# Patient Record
Sex: Male | Born: 2000
Health system: Southern US, Community
[De-identification: ages and names within clinical notes are randomized; demographics above are authoritative.]

## PROBLEM LIST (undated history)

## (undated) DIAGNOSIS — J309 Allergic rhinitis, unspecified: Secondary | ICD-10-CM

## (undated) HISTORY — DX: Allergic rhinitis, unspecified: J30.9

## (undated) HISTORY — PX: TYMPANOSTOMY: SHX2586

---
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
## 2021-03-15 ENCOUNTER — Ambulatory Visit
Admission: EM | Admit: 2021-03-15 | Discharge: 2021-03-15 | Disposition: A | Discharge status: Home / Self Care | Payer: 59 | Attending: Emergency Medicine | Admitting: Emergency Medicine

## 2021-03-15 ENCOUNTER — Other Ambulatory Visit: Payer: Self-pay

## 2021-03-15 DIAGNOSIS — R0989 Other specified symptoms and signs involving the circulatory and respiratory systems: Secondary | ICD-10-CM | POA: Diagnosis not present

## 2021-03-15 DIAGNOSIS — J069 Acute upper respiratory infection, unspecified: Secondary | ICD-10-CM | POA: Diagnosis not present

## 2021-03-15 MED ORDER — PROMETHAZINE-DM 6.25-15 MG/5ML PO SYRP: 5.0000 mL | ORAL_SOLUTION | Freq: Four times a day (QID) | ORAL | 0 refills | Status: DC | PRN

## 2021-03-15 MED ORDER — OSELTAMIVIR PHOSPHATE 75 MG PO CAPS: 75.0000 mg | ORAL_CAPSULE | Freq: Two times a day (BID) | ORAL | 0 refills | Status: DC

## 2021-03-15 MED ORDER — IBUPROFEN 600 MG PO TABS: 600.0000 mg | ORAL_TABLET | Freq: Four times a day (QID) | ORAL | 0 refills | Status: DC | PRN

## 2021-03-15 MED ORDER — FLUTICASONE PROPIONATE 50 MCG/ACT NA SUSP: 2.0000 | Freq: Every day | NASAL | 0 refills | Status: DC

## 2021-03-15 NOTE — ED Triage Notes (Signed)
Patient presents to Urgent Care with complaints of  cough, congestion, fever, body aches, headache, chills since yesterday.   Treating symptoms with ibuprofen and tylenol.

## 2021-03-15 NOTE — ED Provider Notes (Signed)
HPI  SUBJECTIVE:  Mark Rush is a 20 y.o. male who presents with fevers T-max 102.8, body aches, nasal congestion Moderna strep, headache, rhinorrhea, sore throat, nausea, cough productive of the same material as his nasal congestion, shortness of breath, vomiting starting yesterday.  He is tolerating fluids.  No loss of sense of smell or taste, wheezing, nausea, diarrhea, abdominal pain.  No known COVID or flu exposure.  He got his second dose of the COVID-vaccine.  He has not yet gotten the flu vaccine.  He has been alternating 400 mg of ibuprofen with 1-2 Tylenols every 4 hours with improvement in his symptoms.  He has also tried TheraFlu.  Symptoms are worse at night.  States that he is unable to sleep at night secondary to the cough.  He took an antipyretic within 6 hours of evaluation.  He has no past medical history.  PMD: [employer].   History reviewed. No pertinent past medical history.  History reviewed. No pertinent surgical history.  History reviewed. No pertinent family history.  Social History   Tobacco Use   Smoking status: Never   Smokeless tobacco: Never  Vaping Use   Vaping Use: Never used  Substance Use Topics   Alcohol use: Never   Drug use: Never    No current facility-administered medications for this encounter.  Current Outpatient Medications:    fluticasone (FLONASE) 50 MCG/ACT nasal spray, Place 2 sprays into both nostrils daily., Disp: 16 g, Rfl: 0   ibuprofen (ADVIL) 600 MG tablet, Take 1 tablet (600 mg total) by mouth every 6 (six) hours as needed., Disp: 30 tablet, Rfl: 0   oseltamivir (TAMIFLU) 75 MG capsule, Take 1 capsule (75 mg total) by mouth 2 (two) times daily. X 5 days, Disp: 10 capsule, Rfl: 0   promethazine-dextromethorphan (PROMETHAZINE-DM) 6.25-15 MG/5ML syrup, Take 5 mLs by mouth 4 (four) times daily as needed for cough., Disp: 118 mL, Rfl: 0  No Known Allergies   ROS  As noted in HPI.   Physical Exam  BP 134/76  (BP Location: Right Arm)   Pulse 96   Temp 100.1 F (37.8 C) (Oral)   Resp 16   SpO2 97%   Constitutional: Well developed, well nourished, no acute distress Eyes:  EOMI, conjunctiva normal bilaterally HENT: Normocephalic, atraumatic,mucus membranes moist.  Positive nasal congestion.  No sinus tenderness. Neck: No cervical lymphadenopathy Respiratory: Normal inspiratory effort, lungs clear bilaterally Cardiovascular: Normal rate, regular rhythm no murmurs rubs or gallops GI: nondistended skin: No rash, skin intact Musculoskeletal: no deformities Neurologic: Alert & oriented x 3, no focal neuro deficits Psychiatric: Speech and behavior appropriate   ED Course   Medications - No data to display  Orders Placed This Encounter  Procedures   Covid-19, Flu A+B ([facility])    Standing Status:   Standing    Number of Occurrences:   1    No results found for this or any previous visit (from the past 24 hour(s)). No results found.  ED Clinical Impression  1. Viral upper respiratory tract infection with cough   2. Suspected novel influenza A virus infection      ED Assessment/Plan  COVID, flu sent.  Suspect influenza.  Home with Tamiflu, Tylenol/ibuprofen, Mucinex D, Flonase, Promethazine DM, saline nasal irrigation and push fluids.  Unfortunately, he does not qualify for antiviral treatment if his COVID is positive.  Supportive treatment only.  Follow-up with PMD as needed.  ER return precautions given.  School note  Discussed  labs,, MDM, treatment plan, and plan for follow-up with patient. Discussed sn/sx that should prompt return to the ED. patient agrees with plan.   Meds ordered this encounter  Medications   fluticasone (FLONASE) 50 MCG/ACT nasal spray    Sig: Place 2 sprays into both nostrils daily.    Dispense:  16 g    Refill:  0   ibuprofen (ADVIL) 600 MG tablet    Sig: Take 1 tablet (600 mg total) by mouth every 6 (six) hours as needed.    Dispense:  30 tablet     Refill:  0   oseltamivir (TAMIFLU) 75 MG capsule    Sig: Take 1 capsule (75 mg total) by mouth 2 (two) times daily. X 5 days    Dispense:  10 capsule    Refill:  0   promethazine-dextromethorphan (PROMETHAZINE-DM) 6.25-15 MG/5ML syrup    Sig: Take 5 mLs by mouth 4 (four) times daily as needed for cough.    Dispense:  118 mL    Refill:  0      *This clinic note was created using Scientist, clinical (histocompatibility and immunogenetics). Therefore, there may be occasional mistakes despite careful proofreading.  ?    Domenick Gong, MD 03/16/21 [id number]

## 2021-03-15 NOTE — Discharge Instructions (Addendum)
Finish the Tamiflu unless your flu comes back negative, 600 mg of ibuprofen, 1000 mg of Tylenol for 4 times a day as needed for body aches, headaches, fevers., Mucinex D, Flonase, Promethazine DM, saline nasal irrigation with a Lloyd Huger Med rinse and distilled water as often as you want and push electrolyte containing fluids such as Pedialyte and Gatorade.  Stop other cold medicine

## 2021-03-16 LAB — COVID-19, FLU A+B NAA
Influenza A, NAA: NOT DETECTED
Influenza B, NAA: NOT DETECTED
SARS-CoV-2, NAA: NOT DETECTED

## 2021-03-17 ENCOUNTER — Encounter (HOSPITAL_COMMUNITY): Payer: Self-pay

## 2021-03-17 ENCOUNTER — Emergency Department (HOSPITAL_COMMUNITY)
Admission: EM | Admit: 2021-03-17 | Discharge: 2021-03-18 | Disposition: A | Discharge status: Home / Self Care | Payer: 59 | Attending: Emergency Medicine | Admitting: Emergency Medicine

## 2021-03-17 ENCOUNTER — Other Ambulatory Visit: Payer: Self-pay

## 2021-03-17 DIAGNOSIS — R509 Fever, unspecified: Secondary | ICD-10-CM | POA: Diagnosis present

## 2021-03-17 DIAGNOSIS — R000 Tachycardia, unspecified: Secondary | ICD-10-CM | POA: Insufficient documentation

## 2021-03-17 DIAGNOSIS — Z20822 Contact with and (suspected) exposure to covid-19: Secondary | ICD-10-CM | POA: Diagnosis not present

## 2021-03-17 DIAGNOSIS — R0602 Shortness of breath: Secondary | ICD-10-CM | POA: Insufficient documentation

## 2021-03-17 DIAGNOSIS — R1084 Generalized abdominal pain: Secondary | ICD-10-CM | POA: Insufficient documentation

## 2021-03-17 DIAGNOSIS — B2790 Infectious mononucleosis, unspecified without complication: Secondary | ICD-10-CM | POA: Diagnosis not present

## 2021-03-17 DIAGNOSIS — R079 Chest pain, unspecified: Secondary | ICD-10-CM | POA: Insufficient documentation

## 2021-03-17 LAB — RESP PANEL BY RT-PCR (FLU A&B, COVID) ARPGX2
Influenza A by PCR: NEGATIVE
Influenza B by PCR: NEGATIVE
SARS Coronavirus 2 by RT PCR: NEGATIVE

## 2021-03-17 LAB — URINALYSIS, ROUTINE W REFLEX MICROSCOPIC
Bacteria, UA: NONE SEEN
Bilirubin Urine: NEGATIVE
Glucose, UA: NEGATIVE mg/dL
Ketones, ur: 20 mg/dL — AB
Leukocytes,Ua: NEGATIVE
Nitrite: NEGATIVE
Protein, ur: 100 mg/dL — AB
Specific Gravity, Urine: 1.019 (ref 1.005–1.030)
pH: 5 (ref 5.0–8.0)

## 2021-03-17 LAB — COMPREHENSIVE METABOLIC PANEL
ALT: 649 U/L — ABNORMAL HIGH (ref 0–44)
AST: 846 U/L — ABNORMAL HIGH (ref 15–41)
Albumin: 3.7 g/dL (ref 3.5–5.0)
Alkaline Phosphatase: 144 U/L — ABNORMAL HIGH (ref 38–126)
Anion gap: 14 (ref 5–15)
BUN: 13 mg/dL (ref 6–20)
CO2: 22 mmol/L (ref 22–32)
Calcium: 8.7 mg/dL — ABNORMAL LOW (ref 8.9–10.3)
Chloride: 97 mmol/L — ABNORMAL LOW (ref 98–111)
Creatinine, Ser: 1.07 mg/dL (ref 0.61–1.24)
GFR, Estimated: >60 mL/min (ref >60)
Glucose, Bld: 85 mg/dL (ref 70–99)
Potassium: 3.4 mmol/L — ABNORMAL LOW (ref 3.5–5.1)
Sodium: 133 mmol/L — ABNORMAL LOW (ref 135–145)
Total Bilirubin: 1.8 mg/dL — ABNORMAL HIGH (ref 0.3–1.2)
Total Protein: 7.4 g/dL (ref 6.5–8.1)

## 2021-03-17 LAB — CBC WITH DIFFERENTIAL/PLATELET
Abs Immature Granulocytes: 0.12 10*3/uL — ABNORMAL HIGH (ref 0.00–0.07)
Basophils Absolute: 0.1 10*3/uL (ref 0.0–0.1)
Basophils Relative: 1 %
Eosinophils Absolute: 0 10*3/uL (ref 0.0–0.5)
Eosinophils Relative: 0 %
HCT: 40.8 % (ref 39.0–52.0)
Hemoglobin: 13.7 g/dL (ref 13.0–17.0)
Immature Granulocytes: 1 %
Lymphocytes Relative: 47 %
Lymphs Abs: 4.2 10*3/uL — ABNORMAL HIGH (ref 0.7–4.0)
MCH: 23.4 pg — ABNORMAL LOW (ref 26.0–34.0)
MCHC: 33.6 g/dL (ref 30.0–36.0)
MCV: 69.6 fL — ABNORMAL LOW (ref 80.0–100.0)
Monocytes Absolute: 0.3 10*3/uL (ref 0.1–1.0)
Monocytes Relative: 3 %
Neutro Abs: 4.3 10*3/uL (ref 1.7–7.7)
Neutrophils Relative %: 48 %
Platelets: 148 10*3/uL — ABNORMAL LOW (ref 150–400)
RBC: 5.86 MIL/uL — ABNORMAL HIGH (ref 4.22–5.81)
RDW: 19.3 % — ABNORMAL HIGH (ref 11.5–15.5)
WBC Morphology: ABNORMAL
WBC: 9.1 10*3/uL (ref 4.0–10.5)
nRBC: 0 % (ref 0.0–0.2)

## 2021-03-17 LAB — GROUP A STREP BY PCR: Group A Strep by PCR: NOT DETECTED

## 2021-03-17 LAB — LIPASE, BLOOD: Lipase: 42 U/L (ref 11–51)

## 2021-03-17 MED ORDER — LACTATED RINGERS IV BOLUS
1000.0000 mL | Freq: Once | INTRAVENOUS | Status: AC
Start: 2021-03-17 — End: 2021-03-18
  Administered 2021-03-17: 1000 mL via INTRAVENOUS

## 2021-03-17 MED ORDER — LACTATED RINGERS IV BOLUS
1000.0000 mL | Freq: Once | INTRAVENOUS | Status: AC
  Administered 2021-03-17: 1000 mL via INTRAVENOUS

## 2021-03-17 MED ORDER — ONDANSETRON HCL 4 MG/2ML IJ SOLN
4.0000 mg | Freq: Once | INTRAMUSCULAR | Status: AC
  Administered 2021-03-17: 4 mg via INTRAVENOUS
  Filled 2021-03-17: qty 2

## 2021-03-17 MED ORDER — ACETAMINOPHEN 325 MG PO TABS
650.0000 mg | ORAL_TABLET | Freq: Once | ORAL | Status: AC | PRN
  Administered 2021-03-17: 650 mg via ORAL
  Filled 2021-03-17: qty 2

## 2021-03-17 NOTE — ED Triage Notes (Signed)
Pt presents to Ed with flu like sx that started 2 days ago, pt was seen urgent care and dx with URI-pt tested neg for flu and covid- prescribed promethazine-dextromethorphan (PROMETHAZINE-DM) for N/V and tylenol/motrin for fever and pain relief- pt has not taken these since 1 pm today. Temp 103.3 in triage.

## 2021-03-18 ENCOUNTER — Emergency Department (HOSPITAL_COMMUNITY): Payer: 59

## 2021-03-18 LAB — MONONUCLEOSIS SCREEN: Mono Screen: POSITIVE — AB

## 2021-03-18 MED ORDER — IOHEXOL 300 MG/ML  SOLN
100.0000 mL | Freq: Once | INTRAMUSCULAR | Status: AC | PRN
  Administered 2021-03-18: 100 mL via INTRAVENOUS

## 2021-03-18 MED ORDER — ONDANSETRON 4 MG PO TBDP: 4.0000 mg | ORAL_TABLET | Freq: Three times a day (TID) | ORAL | 0 refills | Status: DC | PRN

## 2021-03-18 MED ORDER — IBUPROFEN 600 MG PO TABS: 600.0000 mg | ORAL_TABLET | Freq: Four times a day (QID) | ORAL | 0 refills | Status: DC | PRN

## 2021-03-18 MED ORDER — LACTATED RINGERS IV BOLUS
1000.0000 mL | Freq: Once | INTRAVENOUS | Status: AC
  Administered 2021-03-18: 1000 mL via INTRAVENOUS

## 2021-03-18 NOTE — ED Provider Notes (Signed)
[facility] EMERGENCY DEPARTMENT Provider Note   CSN: [id number] Arrival date & time: 03/17/21  [id number]     History Chief Complaint  Patient presents with   URI    Tested - for flu and covid-dx with URI 2 days ago    Mark Rush is a 20 y.o. male otherwise healthy presents the emergency department for evaluation of 4 days of sore throat, myalgia, headache, fever (T-max 102.8 Fahrenheit), cough, nausea, vomiting, and generalized abdominal pain.  Patient endorses some mild shortness of breath and chest pain with cough.  Denies any diarrhea, constipation, dysuria, hematuria, rash, ear pain, runny nose, or nasal congestion.  Patient was recently seen at urgent care few days ago and had a negative flu and COVID test, but was treated with Tamiflu and given promethazine and ibuprofen 600 mg for fever.  The patient reports despite the treatment, he is feeling worse.  He reports his fever is going down with ibuprofen, but missed his morning dose today.  He denies any medical history.  Surgical history includes TM tubes.  Denies any daily medications.  No known drug allergies.  Up-to-date on vaccinations as a child.  Denies any tobacco, EtOH, or drug use.   URI Presenting symptoms: congestion, cough, fever, rhinorrhea and sore throat   Presenting symptoms: no ear pain   Associated symptoms: headaches and myalgias   Associated symptoms: no arthralgias       History reviewed. No pertinent past medical history.  There are no problems to display for this patient.   History reviewed. No pertinent surgical history.     No family history on file.  Social History   Tobacco Use   Smoking status: Never   Smokeless tobacco: Never  Vaping Use   Vaping Use: Never used  Substance Use Topics   Alcohol use: Never   Drug use: Never    Home Medications Prior to Admission medications   Medication Sig Start Date End Date Taking? Authorizing Provider  ibuprofen (ADVIL) 600 MG tablet Take 1  tablet (600 mg total) by mouth every 6 (six) hours as needed. 03/18/21  Yes Sherrell Puller, PA-C  ondansetron (ZOFRAN ODT) 4 MG disintegrating tablet Take 1 tablet (4 mg total) by mouth every 8 (eight) hours as needed for nausea or vomiting. 03/18/21  Yes Sherrell Puller, PA-C  fluticasone [facility]) 50 MCG/ACT nasal spray Place 2 sprays into both nostrils daily. 03/15/21   Melynda Ripple, MD  ibuprofen (ADVIL) 600 MG tablet Take 1 tablet (600 mg total) by mouth every 6 (six) hours as needed. 03/15/21   Melynda Ripple, MD  oseltamivir (TAMIFLU) 75 MG capsule Take 1 capsule (75 mg total) by mouth 2 (two) times daily. X 5 days 03/15/21   Melynda Ripple, MD  promethazine-dextromethorphan (PROMETHAZINE-DM) 6.25-15 MG/5ML syrup Take 5 mLs by mouth 4 (four) times daily as needed for cough. 03/15/21   Melynda Ripple, MD    Allergies    Patient has no known allergies.  Review of Systems   Review of Systems  Constitutional:  Positive for fever. Negative for chills.  HENT:  Positive for congestion, rhinorrhea and sore throat. Negative for drooling, ear pain and trouble swallowing.   Eyes:  Negative for photophobia, pain, discharge and visual disturbance.  Respiratory:  Positive for cough and shortness of breath.   Cardiovascular:  Negative for chest pain and palpitations.  Gastrointestinal:  Positive for nausea and vomiting. Negative for abdominal pain, constipation and diarrhea.  Genitourinary:  Negative for dysuria and hematuria.  Musculoskeletal:  Positive for myalgias. Negative for arthralgias, back pain and joint swelling.  Skin:  Negative for color change and rash.  Neurological:  Positive for headaches. Negative for seizures, syncope and weakness.  All other systems reviewed and are negative.  Physical Exam Updated Vital Signs BP 131/82   Pulse 61   Temp 98.6 F (37 C) (Oral)   Resp 17   Ht 6' (1.829 m)   Wt 81.6 kg   SpO2 98%   BMI 24.41 kg/m   Physical Exam Vitals and  nursing note reviewed.  Constitutional:      General: He is not in acute distress.    Appearance: He is not toxic-appearing.     Comments: Patient appears uncomfortable, but not toxic appearing  HENT:     Head: Normocephalic and atraumatic.     Right Ear: Tympanic membrane, ear canal and external ear normal.     Left Ear: Tympanic membrane, ear canal and external ear normal.     Nose:     Comments: Bilateral nasal turbinates slightly erythematous and edematous    Mouth/Throat:     Mouth: Mucous membranes are dry.     Pharynx: Oropharyngeal exudate present.     Comments: Dry mucous membranes.  Tonsils are 2+ bilaterally with small white exudates throughout.  Uvula midline.  Airway patent.  Patient speaking in full sentences with ease.  No voice changes.  Controlling secretions. Neck:     Comments: No nuchal rigidity.  Patient has full range of motion of neck.  Tonsillar lymphadenopathy.  No supraclavicular lymphadenopathy. Cardiovascular:     Rate and Rhythm: Tachycardia present.  Pulmonary:     Effort: Pulmonary effort is normal. No respiratory distress.     Breath sounds: Normal breath sounds. No wheezing.  Abdominal:     General: Abdomen is flat.     Palpations: Abdomen is soft.     Tenderness: There is abdominal tenderness. There is no guarding or rebound.     Comments: Diffuse tenderness.  No hepatosplenomegaly appreciated.  No overlying skin changes, rashes, ecchymosis, or erythema noted to the area.  Musculoskeletal:        General: No swelling or deformity.     Cervical back: Normal range of motion.  Lymphadenopathy:     Cervical: Cervical adenopathy present.  Skin:    General: Skin is warm.     Findings: No rash.  Neurological:     General: No focal deficit present.     Mental Status: He is alert. Mental status is at baseline.    ED Results / Procedures / Treatments   Labs (all labs ordered are listed, but only abnormal results are displayed) Labs Reviewed   COMPREHENSIVE METABOLIC PANEL - Abnormal; Notable for the following components:      Result Value   Sodium 133 (*)    Potassium 3.4 (*)    Chloride 97 (*)    Calcium 8.7 (*)    AST 846 (*)    ALT 649 (*)    Alkaline Phosphatase 144 (*)    Total Bilirubin 1.8 (*)    All other components within normal limits  CBC WITH DIFFERENTIAL/PLATELET - Abnormal; Notable for the following components:   RBC 5.86 (*)    MCV 69.6 (*)    MCH 23.4 (*)    RDW 19.3 (*)    Platelets 148 (*)    Lymphs Abs 4.2 (*)    Abs Immature Granulocytes 0.12 (*)    All other components  within normal limits  URINALYSIS, ROUTINE W REFLEX MICROSCOPIC - Abnormal; Notable for the following components:   Color, Urine AMBER (*)    APPearance HAZY (*)    Hgb urine dipstick SMALL (*)    Ketones, ur 20 (*)    Protein, ur 100 (*)    All other components within normal limits  MONONUCLEOSIS SCREEN - Abnormal; Notable for the following components:   Mono Screen POSITIVE (*)    All other components within normal limits  RESP PANEL BY RT-PCR (FLU A&B, COVID) ARPGX2  GROUP A STREP BY PCR  LIPASE, BLOOD    EKG None  Radiology CT ABDOMEN PELVIS W CONTRAST  Result Date: 03/18/2021 CLINICAL DATA:  Elevated LFTs with fevers and chills, initial encounter EXAM: CT ABDOMEN AND PELVIS WITH CONTRAST TECHNIQUE: Multidetector CT imaging of the abdomen and pelvis was performed using the standard protocol following bolus administration of intravenous contrast. CONTRAST:  148mL OMNIPAQUE IOHEXOL 300 MG/ML  SOLN COMPARISON:  None. FINDINGS: Lower chest: Lung bases demonstrate small bilateral pleural effusions. Minimal atelectatic changes are seen. Hepatobiliary: No focal liver abnormality is seen. No gallstones, gallbladder wall thickening, or biliary dilatation. Pancreas: Unremarkable. No pancreatic ductal dilatation or surrounding inflammatory changes. Spleen: Normal in size without focal abnormality. Adrenals/Urinary Tract: Adrenal  glands are within normal limits. Kidneys demonstrate no renal calculi or obstructive changes. Enhancement of the kidneys is patchy bilaterally suggestive of pyelonephritis. No obstructive changes are seen. The bladder is well distended. No obstructive changes the ureters noted. Stomach/Bowel: No obstructive or inflammatory changes of the colon are seen. The appendix is within normal limits. Small bowel and stomach are within normal limits. Vascular/Lymphatic: No significant vascular findings are present. No enlarged abdominal or pelvic lymph nodes. Reproductive: Prostate is unremarkable. Other: No abdominal wall hernia or abnormality. No abdominopelvic ascites. Musculoskeletal: No acute or significant osseous findings. IMPRESSION: Patchy enhancement pattern in the kidneys bilaterally suggestive of pyelonephritis. No obstructive changes are seen. Small bilateral pleural effusions are noted. Electronically Signed   By: Inez Catalina M.D.   On: 03/18/2021 00:45    Procedures Procedures   Medications Ordered in ED Medications  acetaminophen (TYLENOL) tablet 650 mg (650 mg Oral Given 03/17/21 2025)  ondansetron (ZOFRAN) injection 4 mg (4 mg Intravenous Given 03/17/21 2233)  lactated ringers bolus 1,000 mL (0 mLs Intravenous Stopped 03/17/21 2352)  lactated ringers bolus 1,000 mL (0 mLs Intravenous Stopped 03/18/21 0059)  iohexol (OMNIPAQUE) 300 MG/ML solution 100 mL (100 mLs Intravenous Contrast Given 03/18/21 0034)  lactated ringers bolus 1,000 mL (1,000 mLs Intravenous New Bag/Given 03/18/21 0116)    ED Course  I have reviewed the triage vital signs and the nursing notes.  Pertinent labs & imaging results that were available during my care of the patient were reviewed by me and considered in my medical decision making (see chart for details).  19 year old male presents the emergency department with 4 days of cough and cold symptoms.  Differential diagnosis includes but is not limited to COVID, flu,  strep, mono, meningitis, viral illness.  Patient clinically appears dehydrated.  Concern for strep given the pharyngeal erythema with exudate.  Will repeat COVID, flu, and strep.  Additionally, will order basic labs.  Patient was initially started on 1 L of LR and given 4 mg of Zofran.  Patient initially presented tachycardic with a high fever of 103.53F when he was given Tylenol.  Since, patient is afebrile and normal heart rate.  He is still not urinated after 1 L  fluid.  Additional liter of LR ordered.  Patient reports he is no longer feeling nauseous.  I personally reviewed and interpreted the patient's labs and imaging.  Respiratory panel was negative for COVID and flu.  Group A strep negative.  Lipase negative.  CMP shows mild hyponatremia and hypokalemia.  Liver enzymes significantly elevated.  Urinalysis shows amber, hazy urine with ketones and protein.  Consistent with dehydration.  No signs of UTI.  CBC shows no leukocytosis or anemia.  Slight decrease in platelets at 148.  Given any increase in LFTs, a mono spot test was ordered and CT abdomen was placed.  Mono came back as positive.  Additional liter of fluid was ordered at this time.  CT abdomen showed patchy enhancement pattern in the kidneys bilaterally suggestive of pyelonephritis. No obstructive changes are seen. Small bilateral pleural effusions are noted.  No hepatosplenomegaly appreciated.  No concern for pyelonephritis as the patient does not have any urinary symptoms.  Lab and imaging results were discussed with parent and patient in the room.  I discussed with him that the treatment for mono is supportive care, i.e. to continue with the ibuprofen 60 mg every 6 hours, maintaining good hydration, and resting.  I gave him strict precautions against any contact sports to prevent any splenic rupture.  I advised him to follow-up with their primary care office at [facility] for recheck of his platelets and LFTs in 1 week.  Prescribed the  patient Zofran and additional refill of the ibuprofen 600 mg to take.  I advised mom against using Tylenol as his LFTs are increased.  Strict return precautions given.  Parent and patient agree to plan.  Patient is stable and being discharged home in good condition.    MDM Rules/Calculators/A&P                          Final Clinical Impression(s) / ED Diagnoses Final diagnoses:  Infectious mononucleosis without complication, infectious mononucleosis due to unspecified organism    Rx / DC Orders ED Discharge Orders          Ordered    ondansetron (ZOFRAN ODT) 4 MG disintegrating tablet  Every 8 hours PRN        03/18/21 0145    ibuprofen (ADVIL) 600 MG tablet  Every 6 hours PRN        03/18/21 0145             Sherrell Puller, PA-C 03/18/21 0214    Milton Ferguson, MD 03/19/21 1103

## 2021-03-18 NOTE — Discharge Instructions (Addendum)
You are seen here today for evaluation of your cough and cold symptoms.  You are diagnosed with mono.  The treatment for mono is supportive care, so please keep continuing using the ibuprofen 600 mg every 6 hours, drinking plenty of fluids, mainly water, and resting.  You have been prescribed Zofran, an antinausea medication, to take as needed.  With mono you want to avoid any contact sports for 2 to 4 weeks.  Please follow-up with your primary care provider in a week to recheck your liver enzymes.  If you have any concern, new or worsening symptoms, please return to the nearest emergency department for reevaluation.

## 2021-04-19 ENCOUNTER — Ambulatory Visit (INDEPENDENT_AMBULATORY_CARE_PROVIDER_SITE_OTHER): Payer: 59 | Admitting: Internal Medicine

## 2021-04-19 ENCOUNTER — Encounter: Payer: Self-pay | Admitting: Internal Medicine

## 2021-04-19 ENCOUNTER — Other Ambulatory Visit: Payer: Self-pay

## 2021-04-19 VITALS — BP 150/88 | HR 104 | Temp 104.0°F | Ht 72.0 in | Wt 173.4 lb

## 2021-04-19 DIAGNOSIS — R7989 Other specified abnormal findings of blood chemistry: Secondary | ICD-10-CM

## 2021-04-19 NOTE — Progress Notes (Signed)
Primary Care Physician:  Samuella Bruin Primary Gastroenterologist:  Dr. Marletta Lor  Chief Complaint  Patient presents with   Elevated Hepatic Enzymes    HPI:   Mark Rush is a 20 y.o. male who presents to clinic today by referral from his PCP Oletta Darter due to abnormal liver function test.  Patient was diagnosed with mononucleosis approximately 1 month ago.  He presented to the ER given worsening fever and upper respiratory symptoms.  Blood work showed significantly elevated liver function tests, AST 846, ALT 649, T bili 1.8, alk phos 144.  CT abdomen pelvis unremarkable from a GI standpoint.  Did make mention of small bilateral pleural effusions as well as possible pyelonephritis without obstructive changes.  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  He had repeat blood work on 04/06/2021 with improved LFTs, AST 134, ALT 178, alk phos 171, T bili 0.8.  Patient denies any personal or family history of liver disease.  No illicit drug use.  No alcohol use.  No herbal supplements.  No exposure to viral hepatitis that he knows of.  Was taking Tylenol while he was sick with mono and having fevers though states he never took more than 2 pills in a day.  No abdominal pain.  Past Medical History:  Diagnosis Date   Allergic rhinitis     Past Surgical History:  Procedure Laterality Date   TYMPANOSTOMY      No current outpatient medications on file.   No current facility-administered medications for this visit.    Allergies as of 04/19/2021   (No Known Allergies)    Family History  Problem Relation Age of Onset   Multiple sclerosis Mother     Social History   Socioeconomic History   Marital status: Single    Spouse name: Not on file   Number of children: Not on file   Years of education: Not on file   Highest education level: Not on file  Occupational History   Not on file  Tobacco Use   Smoking status: Never   Smokeless  tobacco: Never  Vaping Use   Vaping Use: Never used  Substance and Sexual Activity   Alcohol use: Never   Drug use: Never   Sexual activity: Not on file  Other Topics Concern   Not on file  Social History Narrative   Not on file   Social Determinants of Health   Financial Resource Strain: Not on file  Food Insecurity: Not on file  Transportation Needs: Not on file  Physical Activity: Not on file  Stress: Not on file  Social Connections: Not on file  Intimate Partner Violence: Not on file    Subjective: Review of Systems  Constitutional:  Negative for chills and fever.  HENT:  Negative for congestion and hearing loss.   Eyes:  Negative for blurred vision and double vision.  Respiratory:  Negative for cough and shortness of breath.   Cardiovascular:  Negative for chest pain and palpitations.  Gastrointestinal:  Negative for abdominal pain, blood in stool, constipation, diarrhea, heartburn, melena and vomiting.  Genitourinary:  Negative for dysuria and urgency.  Musculoskeletal:  Negative for joint pain and myalgias.  Skin:  Negative for itching and rash.  Neurological:  Negative for dizziness and headaches.  Psychiatric/Behavioral:  Negative for depression. The patient is not nervous/anxious.       Objective: BP (!) 150/88    Pulse (!) 104    Temp (!) 104  F (40 C)    Ht 6' (1.829 m)    Wt 173 lb 6.4 oz (78.7 kg)    BMI 23.52 kg/m  Physical Exam Constitutional:      Appearance: Normal appearance.  HENT:     Head: Normocephalic and atraumatic.  Eyes:     Extraocular Movements: Extraocular movements intact.     Conjunctiva/sclera: Conjunctivae normal.  Cardiovascular:     Rate and Rhythm: Normal rate and regular rhythm.  Pulmonary:     Effort: Pulmonary effort is normal.     Breath sounds: Normal breath sounds.  Abdominal:     General: Bowel sounds are normal.     Palpations: Abdomen is soft.  Musculoskeletal:        General: Normal range of motion.      Cervical back: Normal range of motion and neck supple.  Skin:    General: Skin is warm.  Neurological:     General: No focal deficit present.     Mental Status: He is alert and oriented to person, place, and time.  Psychiatric:        Mood and Affect: Mood normal.        Behavior: Behavior normal.     Assessment: *Abnormal liver function tests  Plan: Patient's abnormal liver function test likely consequence of his viral infection with EBV.  CT reassuring from a hepatobiliary standpoint.  LFTs already improved on most recent blood test.  We will plan on repeat LFTs in 2 weeks to ensure that they continue to improve as expected.  If still elevated, will perform full serological work-up for other underlying liver conditions.    Patient to follow-up in 3 to 4 months or sooner if needed.  Thank you Collene Mares for the kind referral.  04/19/2021 9:33 AM   Disclaimer: This note was dictated with voice recognition software. Similar sounding words can inadvertently be transcribed and may not be corrected upon review.

## 2021-04-19 NOTE — Patient Instructions (Signed)
Likely the cause of your elevated liver function tests was due to mononucleosis infection.  That being said, I am going to check blood work at [facility] today to rule out other conditions.  Follow-up with GI in 3 to 4 months.  It was nice meeting you today.  Enjoy your holiday vacation from school.  Dr. Marletta Lor  At [facility] we value your feedback. You may receive a survey about your visit today. Please share your experience as we strive to create trusting relationships with our patients to provide genuine, compassionate, quality care.  We appreciate your understanding and patience as we review any laboratory studies, imaging, and other diagnostic tests that are ordered as we care for you. Our office policy is 5 business days for review of these results, and any emergent or urgent results are addressed in a timely manner for your best interest. If you do not hear from our office in 1 week, please contact us.   We also encourage the use of MyChart, which contains your medical information for your review as well. If you are not enrolled in this feature, an access code is on this after visit summary for your convenience. Thank you for allowing [place] to be involved in your care.  It was great to see you today!  I hope you have a great rest of your Winter!    Hennie Duos. Marletta Lor, D.O. [facility]ology [facility]

## 2021-07-12 ENCOUNTER — Encounter: Payer: Self-pay | Admitting: Internal Medicine

## 2022-07-06 ENCOUNTER — Other Ambulatory Visit: Payer: Self-pay

## 2022-07-06 ENCOUNTER — Encounter (HOSPITAL_COMMUNITY): Payer: Self-pay | Admitting: Emergency Medicine

## 2022-07-06 ENCOUNTER — Emergency Department (HOSPITAL_COMMUNITY): Payer: 59

## 2022-07-06 ENCOUNTER — Emergency Department (HOSPITAL_COMMUNITY)
Admission: EM | Admit: 2022-07-06 | Discharge: 2022-07-06 | Disposition: A | Discharge status: Home / Self Care | Payer: 59 | Attending: Emergency Medicine | Admitting: Emergency Medicine

## 2022-07-06 DIAGNOSIS — Z20822 Contact with and (suspected) exposure to covid-19: Secondary | ICD-10-CM | POA: Diagnosis not present

## 2022-07-06 DIAGNOSIS — R079 Chest pain, unspecified: Secondary | ICD-10-CM

## 2022-07-06 DIAGNOSIS — R0602 Shortness of breath: Secondary | ICD-10-CM | POA: Insufficient documentation

## 2022-07-06 DIAGNOSIS — R0789 Other chest pain: Secondary | ICD-10-CM | POA: Insufficient documentation

## 2022-07-06 DIAGNOSIS — Z87891 Personal history of nicotine dependence: Secondary | ICD-10-CM | POA: Diagnosis not present

## 2022-07-06 LAB — RESP PANEL BY RT-PCR (RSV, FLU A&B, COVID)  RVPGX2
Influenza A by PCR: NEGATIVE
Influenza B by PCR: NEGATIVE
Resp Syncytial Virus by PCR: NEGATIVE
SARS Coronavirus 2 by RT PCR: NEGATIVE

## 2022-07-06 LAB — BASIC METABOLIC PANEL
Anion gap: 9 (ref 5–15)
BUN: 13 mg/dL (ref 6–20)
CO2: 26 mmol/L (ref 22–32)
Calcium: 9.3 mg/dL (ref 8.9–10.3)
Chloride: 101 mmol/L (ref 98–111)
Creatinine, Ser: 0.99 mg/dL (ref 0.61–1.24)
GFR, Estimated: >60 mL/min (ref >60)
Glucose, Bld: 85 mg/dL (ref 70–99)
Potassium: 3.6 mmol/L (ref 3.5–5.1)
Sodium: 136 mmol/L (ref 135–145)

## 2022-07-06 LAB — CBC
HCT: 42.4 % (ref 39.0–52.0)
Hemoglobin: 13.2 g/dL (ref 13.0–17.0)
MCH: 22 pg — ABNORMAL LOW (ref 26.0–34.0)
MCHC: 31.1 g/dL (ref 30.0–36.0)
MCV: 70.7 fL — ABNORMAL LOW (ref 80.0–100.0)
Platelets: 263 10*3/uL (ref 150–400)
RBC: 6 MIL/uL — ABNORMAL HIGH (ref 4.22–5.81)
RDW: 18.2 % — ABNORMAL HIGH (ref 11.5–15.5)
WBC: 8.2 10*3/uL (ref 4.0–10.5)
nRBC: 0 % (ref 0.0–0.2)

## 2022-07-06 LAB — TROPONIN I (HIGH SENSITIVITY): Troponin I (High Sensitivity): 2 ng/L (ref <18)

## 2022-07-06 MED ORDER — FAMOTIDINE 20 MG PO TABS
20.0000 mg | ORAL_TABLET | Freq: Once | ORAL | Status: AC
  Administered 2022-07-06: 20 mg via ORAL
  Filled 2022-07-06: qty 1

## 2022-07-06 MED ORDER — ALUM & MAG HYDROXIDE-SIMETH 200-200-20 MG/5ML PO SUSP
15.0000 mL | Freq: Once | ORAL | Status: AC
  Administered 2022-07-06: 15 mL via ORAL
  Filled 2022-07-06: qty 30

## 2022-07-06 NOTE — ED Triage Notes (Signed)
Patient c/o left side chest pain with radiation into shoulder. Per patient shortness of breath. Denies any nausea, vomiting, dizziness, or weakness. Denies any cardiac hx. Per patient started at 12 today and was intermittent but has became more constant around 5pm today. Patient reports feeling irregular heartbeats and reports pulse check of 104 today.

## 2022-07-06 NOTE — Discharge Instructions (Signed)
Evaluation for your chest pain was overall reassuring.  If you have worsening chest pain, shortness of breath, calf tenderness or any other concerning symptom please return emergency department for further evaluation.  Otherwise recommend that you follow-up with your PCP for your chest pain.

## 2022-07-06 NOTE — ED Provider Notes (Signed)
Accepted handoff at shift change from [facility], [place]. Please see prior provider note for more detail.   Briefly: Patient is 22 y.o.-year-old male presenting for chest pain shortness of breath.  DDX: concern for  ACS, PE, pneumothorax, pneumonia, arrhythmia  Plan: Follow-up on troponin.  Reassess patient's symptoms.  If negative troponin and symptoms improved.  Patient appropriate to discharge home with follow-up with PCP.  Physical Exam  BP 131/84   Pulse 80   Temp 97.8 F (36.6 C) (Oral)   Resp (!) 23   Ht 6' (1.829 m)   Wt 86.2 kg   SpO2 99%   BMI 25.77 kg/m   Physical Exam  Procedures  Procedures  ED Course / MDM    Medical Decision Making Amount and/or Complexity of Data Reviewed Labs: ordered. Radiology: ordered.  Risk OTC drugs.   Initial troponin negative.  Patient also stated that his symptoms have improved after GI cocktail.  Advised to follow-up with his PCP.  Vital signs stable discharge.       Harriet Pho, PA-C 07/06/22 2015    Dorie Rank, MD 07/07/22 [phone]

## 2022-07-06 NOTE — ED Provider Notes (Signed)
[facility] Provider Note   CSN: [id number] Arrival date & time: 07/06/22  1735     History  Chief Complaint  Patient presents with   Chest Pain    Mark Rush is a 22 y.o. male.   Chest Pain   22 year old male presents emergency department with complaints of chest pain, shortness of breath.  Patient states that he has been experiencing symptoms over the past 1+ months.  Has noticed left-sided chest pain described as pressure.  Reports some associated shortness of breath with episodes.  States that he has "been up all night" with symptoms and other times, symptoms lasted a matter of seconds.  Reports feeling of palpitations earlier today and checking his heart rate manually which was greater than 100 prompting visit to the emergency department.  States that symptoms seem to be related to the nighttime and lying flat.  Reports history of asthma and no other cardiopulmonary complications.  Denies fever, cough, congestion, abdominal pain, nausea, vomiting.  Denies history of sudden cardiac death at young age per mother and patient.  Denies history of DVT/PE, recent surgery/immobilization/travel, hormone use, known malignancy. Patient states he is learning about heart failure and school is concerned about having heart failure himself.  No family history of heart failure.  Patient currently without symptoms.  Past medical history significant for allergic rhinitis  Home Medications Prior to Admission medications   Medication Sig Start Date End Date Taking? Authorizing Provider  cetirizine (ZYRTEC) 10 MG tablet Take 10 mg by mouth daily.   Yes [provider]      Allergies    Patient has no known allergies.    Review of Systems   Review of Systems  Cardiovascular:  Positive for chest pain.  All other systems reviewed and are negative.   Physical Exam Updated Vital Signs BP 138/86   Pulse 76   Resp (!) 26   Ht 6' (1.829  m)   Wt 86.2 kg   BMI 25.77 kg/m  Physical Exam Vitals and nursing note reviewed.  Constitutional:      General: He is not in acute distress.    Appearance: He is well-developed.  HENT:     Head: Normocephalic and atraumatic.  Eyes:     Conjunctiva/sclera: Conjunctivae normal.  Cardiovascular:     Rate and Rhythm: Normal rate and regular rhythm.     Pulses: Normal pulses.     Heart sounds: Normal heart sounds. No murmur heard.    No friction rub. No gallop.  Pulmonary:     Effort: Pulmonary effort is normal. No respiratory distress.     Breath sounds: Normal breath sounds. No wheezing, rhonchi or rales.  Chest:     Chest wall: No tenderness.  Abdominal:     General: There is no distension.     Palpations: Abdomen is soft.     Tenderness: There is no abdominal tenderness. There is no right CVA tenderness, left CVA tenderness, guarding or rebound.  Musculoskeletal:        General: No swelling.     Cervical back: Neck supple.     Right lower leg: No edema.     Left lower leg: No edema.  Skin:    General: Skin is warm and dry.     Capillary Refill: Capillary refill takes less than 2 seconds.  Neurological:     Mental Status: He is alert.  Psychiatric:        Mood  and Affect: Mood normal.     ED Results / Procedures / Treatments   Labs (all labs ordered are listed, but only abnormal results are displayed) Labs Reviewed  CBC - Abnormal; Notable for the following components:      Result Value   RBC 6.00 (*)    MCV 70.7 (*)    MCH 22.0 (*)    RDW 18.2 (*)    All other components within normal limits  RESP PANEL BY RT-PCR (RSV, FLU A&B, COVID)  [id number]  BASIC METABOLIC PANEL  TROPONIN I (HIGH SENSITIVITY)    EKG EKG Interpretation  Date/Time:  Saturday July 06 2022 17:48:01 EST Ventricular Rate:  81 PR Interval:  141 QRS Duration: 98 QT Interval:  360 QTC Calculation: 418 R Axis:   83 Text Interpretation: Sinus rhythm No old tracing to compare Confirmed by  Dorie Rank [id number]) on 07/06/2022 5:52:35 PM  Radiology DG Chest 2 View  Result Date: 07/06/2022 CLINICAL DATA:  Chest pain. EXAM: CHEST - 2 VIEW COMPARISON:  None Available. FINDINGS: No focal consolidation, pleural effusion, pneumothorax. The cardiac silhouette is within limits. No acute osseous pathology. IMPRESSION: No active cardiopulmonary disease. Electronically Signed   By: Anner Crete M.D.   On: 07/06/2022 18:22    Procedures Procedures    Medications Ordered in ED Medications  alum & mag hydroxide-simeth (MAALOX/MYLANTA) 200-200-20 MG/5ML suspension 15 mL (15 mLs Oral Given 07/06/22 1835)  famotidine (PEPCID) tablet 20 mg (20 mg Oral Given 07/06/22 1836)    ED Course/ Medical Decision Making/ A&P                             Medical Decision Making Amount and/or Complexity of Data Reviewed Labs: ordered. Radiology: ordered.  Risk OTC drugs.   This patient presents to the ED for concern of chest pain, this involves an extensive number of treatment options, and is a complaint that carries with it a high risk of complications and morbidity.  The differential diagnosis includes ACS, PE, pneumothorax, pneumonia, arrhythmia,   Co morbidities that complicate the patient evaluation  See HPI   Additional history obtained:  Additional history obtained from EMR External records from outside source obtained and reviewed including hospital records   Lab Tests:  I Ordered, and personally interpreted labs.  The pertinent results include: No leukocytosis noted.  No evidence anemia.  Platelets within normal range.  Respiratory viral panel negative for COVID, flu, RSV.  BNP and troponin pending.   Imaging Studies ordered:  I ordered imaging studies including chest x-ray I independently visualized and interpreted imaging which showed no acute cardiopulmonary abnormalities I agree with the radiologist interpretation   Cardiac Monitoring: / EKG:  The patient was maintained  on a cardiac monitor.  I personally viewed and interpreted the cardiac monitored which showed an underlying rhythm of: Sinus rhythm without acute ischemic changes   Consultations Obtained:  N/a   Problem List / ED Course / Critical interventions / Medication management  Chest pain I ordered medication including feeding, Pepcid   Reevaluation of the patient after these medicines showed that the patient improved I have reviewed the patients home medicines and have made adjustments as needed   Social Determinants of Health:  Former cigarette use.  Denies illicit drug use.   Test / Admission - Considered:  Chest pain/shortness of breath Vitals signs within normal range and stable throughout visit. Laboratory/imaging studies significant for: See above 71 year old healthy  male presents emergency department with complaints of chest pain and shortness of breath.  Very vague description of symptoms; not exacerbated with physical activity. Treated with a GI cocktail with some improvement on the emergency department.  Majority of laboratory studies pending.  Chest x-ray without abnormality as well as EKG concerning for sinus rhythm without acute ischemic changes.  Low suspicion for ACS with pending troponin at this time.  Patient PERC criteria negative so doubt PE.  Doubt pneumonia, aortic dissection, tamponade/pericarditis/myocarditis, pneumothorax.  Query possible reflux versus anxiety. Patient will be given information for cardiology follow-up.  At shift change, patient care handoff to [facility]  Disposition pending.  Patient stable upon shift change.         Final Clinical Impression(s) / ED Diagnoses Final diagnoses:  None    Rx / DC Orders ED Discharge Orders     None         Wilnette Kales, [place] 07/06/22 1904    Dorie Rank, MD 07/07/22 1724

## 2023-03-03 IMAGING — CT CT ABD-PELV W/ CM
2 of 4 series · 15 of 46 positions shown, 17 images · IV contrast (omnipaque)
Comparison: None.

CLINICAL DATA: Elevated LFTs with fevers and chills, initial
encounter

EXAM:
CT ABDOMEN AND PELVIS WITH CONTRAST
TECHNIQUE: Multidetector CT imaging of the abdomen and pelvis was performed
using the standard protocol following bolus administration of
intravenous contrast.
CONTRAST:  100mL OMNIPAQUE IOHEXOL 300 MG/ML  SOLN

[Series 2: axial st · axial · 0.77mm/px · z∈[+823,+1303]mm · 12 of 106 slices shown, 14 images]
[im 5/106  soft-tissue]
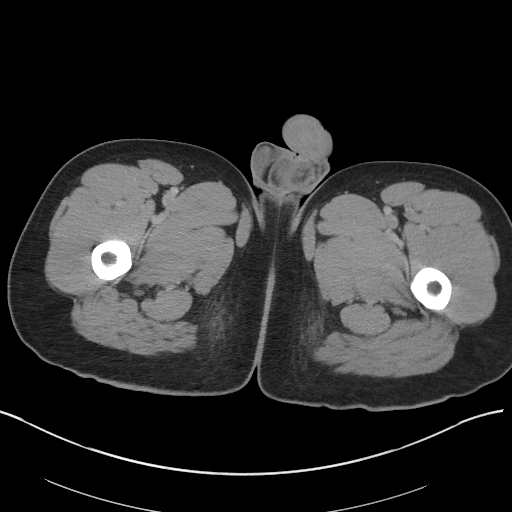
[im 5/106  bone]
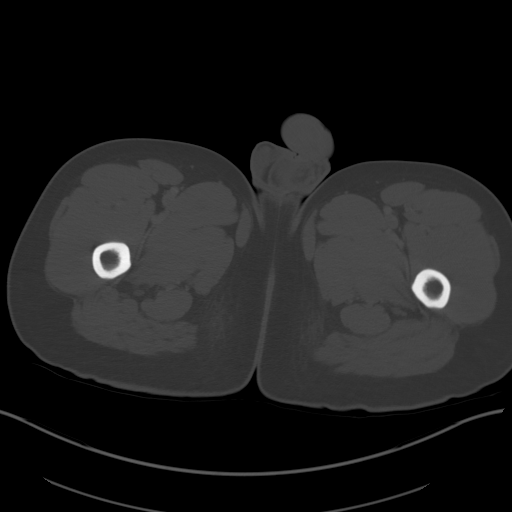
[im 14/106  soft-tissue]
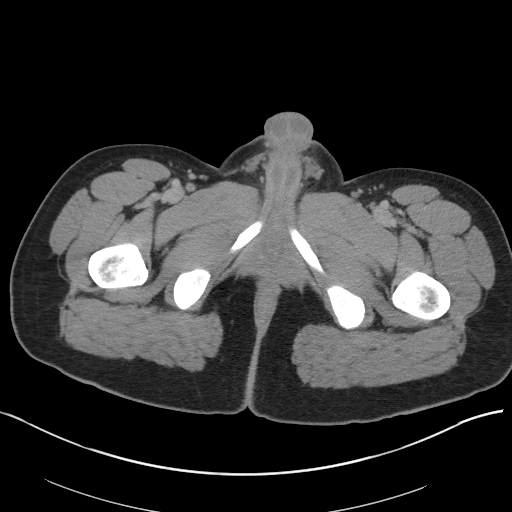
[im 22/106  soft-tissue]
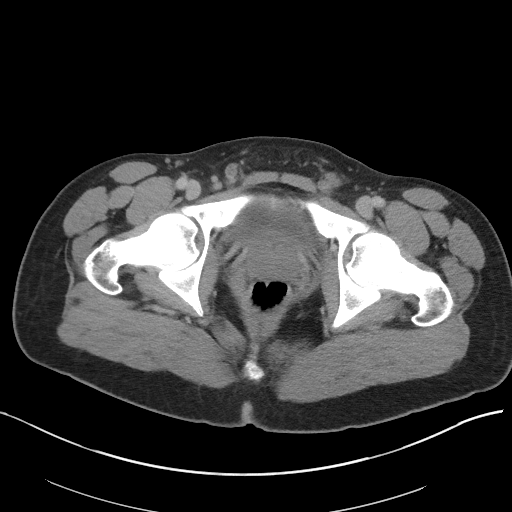
[im 31/106  soft-tissue]
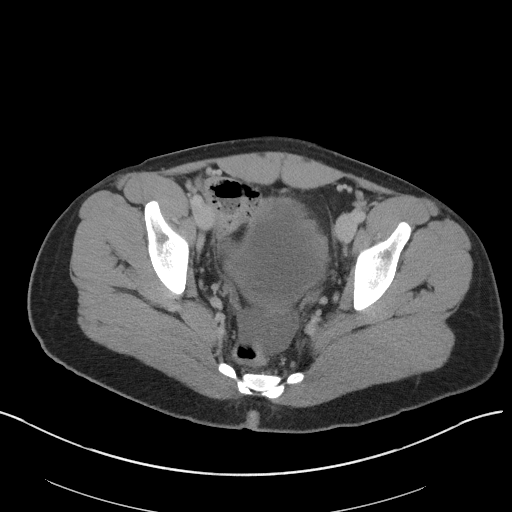
[im 40/106  soft-tissue]
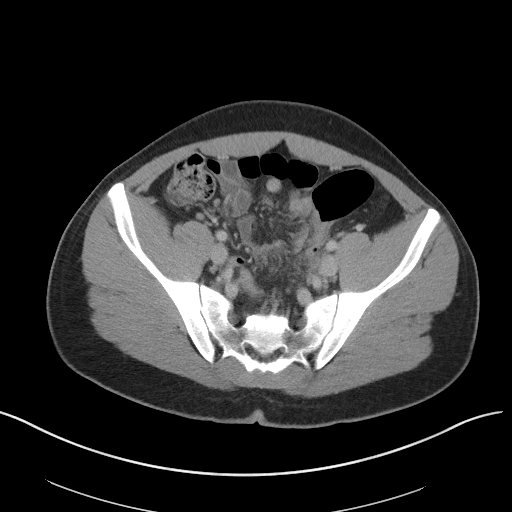
[im 49/106  soft-tissue]
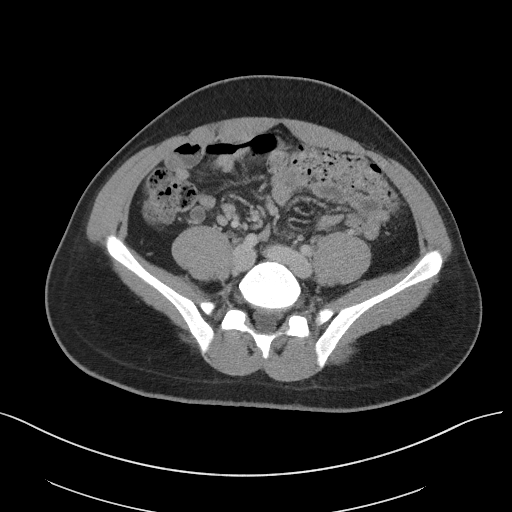
[im 57/106  soft-tissue]
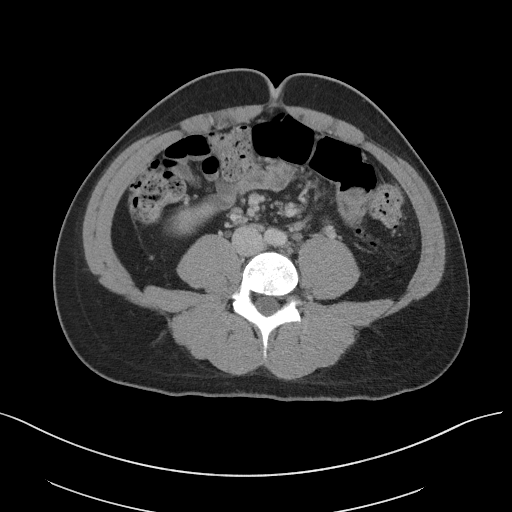
[im 66/106  soft-tissue]
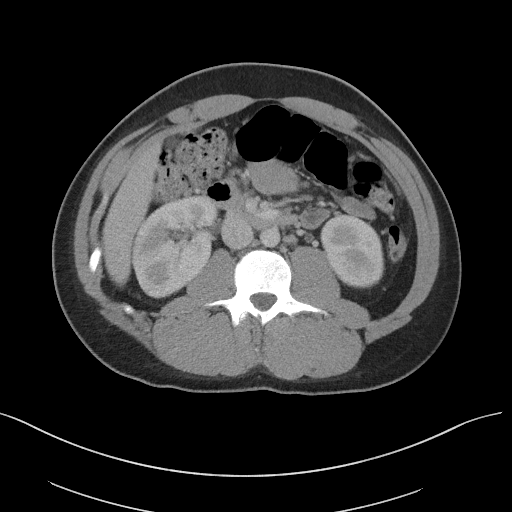
[im 75/106  soft-tissue]
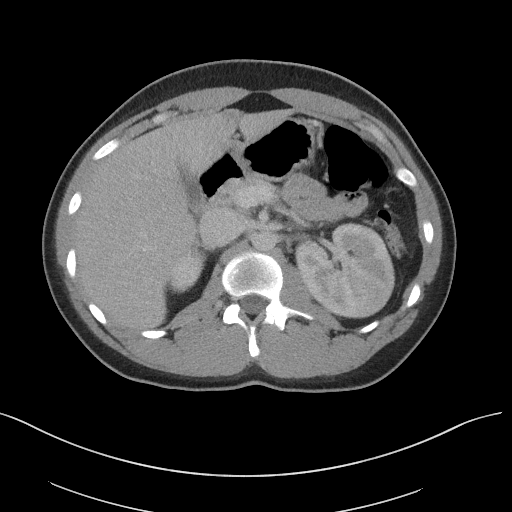
[im 75/106  bone]
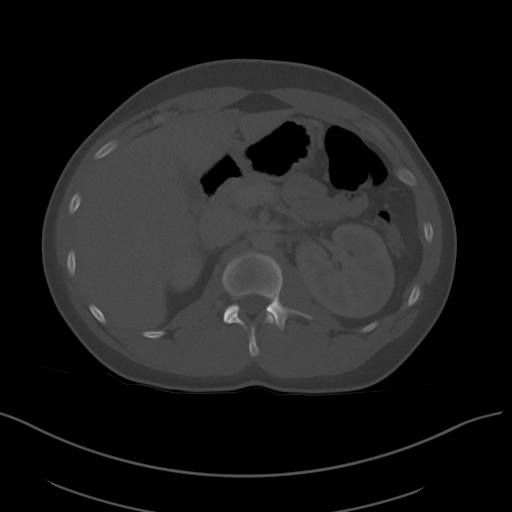
[im 84/106  soft-tissue]
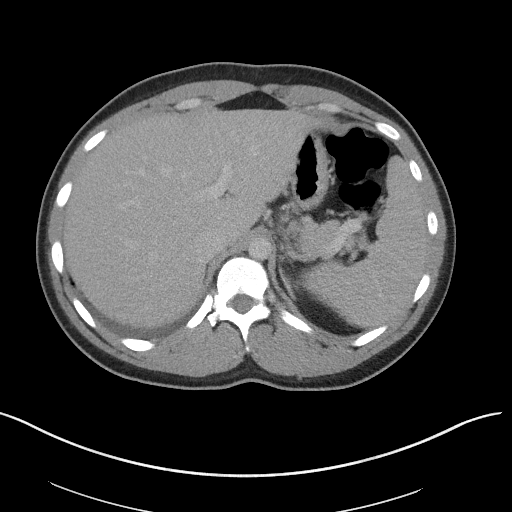
[im 92/106  soft-tissue]
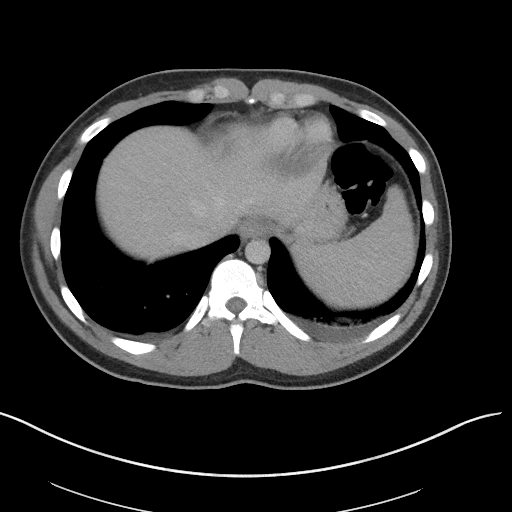
[im 101/106  soft-tissue]
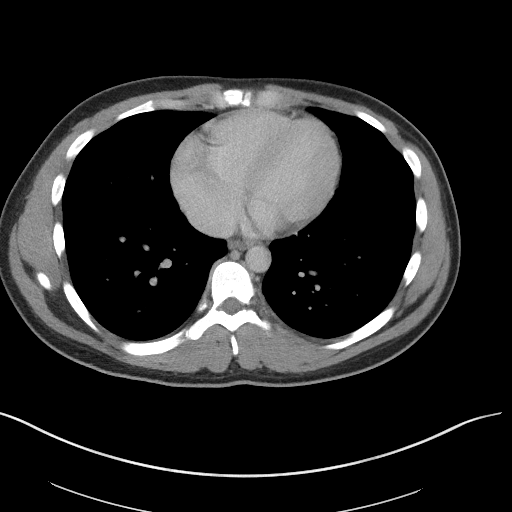

[Series 5: coronal st · coronal · 0.71mm/px · 3 of 102 slices shown]
[im 34/102  soft-tissue]
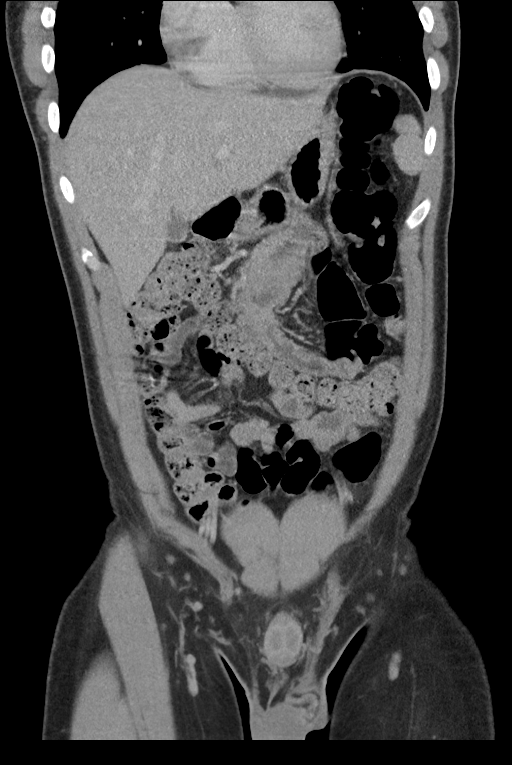
[im 45/102  soft-tissue]
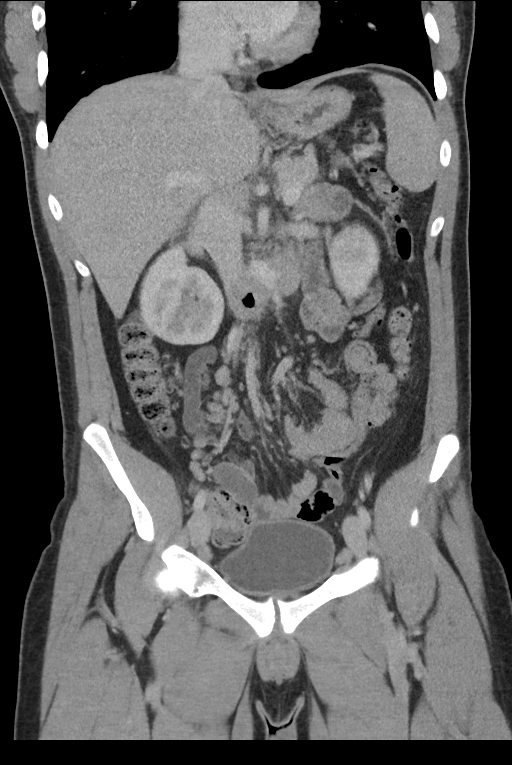
[im 57/102  soft-tissue]
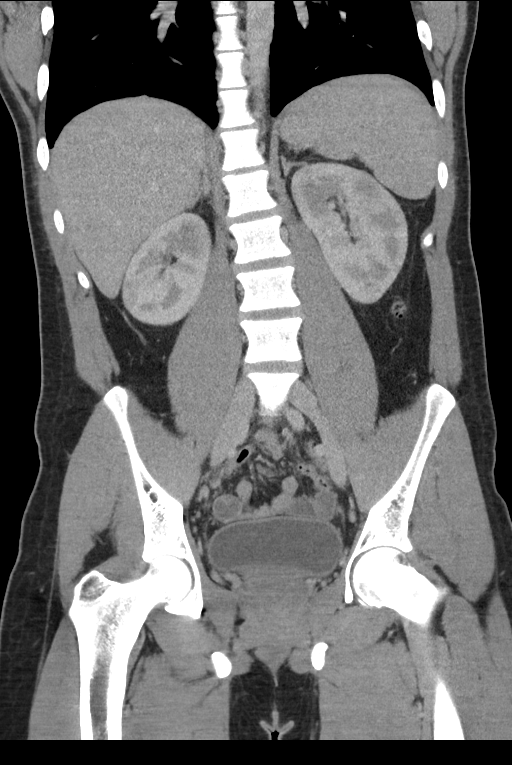

[15 of 46 positions shown; findings below may reference images not displayed]

FINDINGS: Lower chest: Lung bases demonstrate small bilateral pleural
effusions. Minimal atelectatic changes are seen.

Hepatobiliary: No focal liver abnormality is seen. No gallstones,
gallbladder wall thickening, or biliary dilatation.

Pancreas: Unremarkable. No pancreatic ductal dilatation or
surrounding inflammatory changes.

Spleen: Normal in size without focal abnormality.

Adrenals/Urinary Tract: Adrenal glands are within normal limits.
Kidneys demonstrate no renal calculi or obstructive changes.
Enhancement of the kidneys is patchy bilaterally suggestive of
pyelonephritis. No obstructive changes are seen. The bladder is well
distended. No obstructive changes the ureters noted.

Stomach/Bowel: No obstructive or inflammatory changes of the colon
are seen. The appendix is within normal limits. Small bowel and
stomach are within normal limits.

Vascular/Lymphatic: No significant vascular findings are present. No
enlarged abdominal or pelvic lymph nodes.

Reproductive: Prostate is unremarkable.

Other: No abdominal wall hernia or abnormality. No abdominopelvic
ascites.

Musculoskeletal: No acute or significant osseous findings.
IMPRESSION: Patchy enhancement pattern in the kidneys bilaterally suggestive of
pyelonephritis. No obstructive changes are seen.

Small bilateral pleural effusions are noted.

## 2024-04-07 ENCOUNTER — Emergency Department (HOSPITAL_BASED_OUTPATIENT_CLINIC_OR_DEPARTMENT_OTHER)
Admission: EM | Admit: 2024-04-07 | Discharge: 2024-04-07 | Disposition: A | Discharge status: Home / Self Care | Attending: Emergency Medicine | Admitting: Emergency Medicine

## 2024-04-07 ENCOUNTER — Encounter (HOSPITAL_BASED_OUTPATIENT_CLINIC_OR_DEPARTMENT_OTHER): Payer: Self-pay | Admitting: Emergency Medicine

## 2024-04-07 ENCOUNTER — Other Ambulatory Visit: Payer: Self-pay

## 2024-04-07 DIAGNOSIS — S161XXA Strain of muscle, fascia and tendon at neck level, initial encounter: Secondary | ICD-10-CM | POA: Insufficient documentation

## 2024-04-07 DIAGNOSIS — Y92410 Unspecified street and highway as the place of occurrence of the external cause: Secondary | ICD-10-CM | POA: Diagnosis not present

## 2024-04-07 DIAGNOSIS — M549 Dorsalgia, unspecified: Secondary | ICD-10-CM | POA: Insufficient documentation

## 2024-04-07 DIAGNOSIS — S199XXA Unspecified injury of neck, initial encounter: Secondary | ICD-10-CM | POA: Diagnosis present

## 2024-04-07 MED ORDER — IBUPROFEN 800 MG PO TABS: 800.0000 mg | ORAL_TABLET | Freq: Three times a day (TID) | ORAL | 0 refills | Status: AC | PRN

## 2024-04-07 MED ORDER — METHOCARBAMOL 500 MG PO TABS: 500.0000 mg | ORAL_TABLET | Freq: Four times a day (QID) | ORAL | 0 refills | Status: AC

## 2024-04-07 NOTE — Discharge Instructions (Addendum)
 Return if any problems.
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
